# Patient Record
Sex: Male | Born: 1995 | Hispanic: Yes | Marital: Single | Smoking: Never smoker
Health system: Southern US, Community
[De-identification: ages and names within clinical notes are randomized; demographics above are authoritative.]

## PROBLEM LIST (undated history)

## (undated) DIAGNOSIS — R569 Unspecified convulsions: Secondary | ICD-10-CM

## (undated) DIAGNOSIS — J45909 Unspecified asthma, uncomplicated: Secondary | ICD-10-CM

## (undated) HISTORY — DX: Unspecified convulsions: R56.9

---
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
## 2019-08-03 ENCOUNTER — Other Ambulatory Visit: Payer: Self-pay

## 2019-08-03 ENCOUNTER — Ambulatory Visit (HOSPITAL_COMMUNITY)
Admission: EM | Admit: 2019-08-03 | Discharge: 2019-08-03 | Disposition: A | Discharge status: Home / Self Care | Payer: BC Managed Care – PPO | Attending: Family Medicine | Admitting: Family Medicine

## 2019-08-03 ENCOUNTER — Ambulatory Visit (INDEPENDENT_AMBULATORY_CARE_PROVIDER_SITE_OTHER): Payer: BC Managed Care – PPO

## 2019-08-03 ENCOUNTER — Encounter (HOSPITAL_COMMUNITY): Payer: Self-pay

## 2019-08-03 DIAGNOSIS — W19XXXA Unspecified fall, initial encounter: Secondary | ICD-10-CM | POA: Diagnosis not present

## 2019-08-03 DIAGNOSIS — S60012A Contusion of left thumb without damage to nail, initial encounter: Secondary | ICD-10-CM

## 2019-08-03 DIAGNOSIS — M25562 Pain in left knee: Secondary | ICD-10-CM

## 2019-08-03 DIAGNOSIS — S8992XA Unspecified injury of left lower leg, initial encounter: Secondary | ICD-10-CM

## 2019-08-03 DIAGNOSIS — M79642 Pain in left hand: Secondary | ICD-10-CM | POA: Diagnosis not present

## 2019-08-03 MED ORDER — IBUPROFEN 800 MG PO TABS: 800.0000 mg | ORAL_TABLET | Freq: Three times a day (TID) | ORAL | 0 refills | Status: AC

## 2019-08-03 NOTE — ED Triage Notes (Signed)
Pt had a ground level fall this morning and fell onto left knee and left hand. Pt c/o 6/10 throbbing left hand, 5/10 stabbing left knee pain. Pt has numbness in left hand and in certain areas of his left knee. Pt has 1+ edema of left hand, 2+ left radial pulse, cap refill less than 3 sec, 2/5 left hand grip. Pt has 1+ edema of left knee. Pt has scrape on left knee w/dried blood on it.

## 2019-08-03 NOTE — ED Provider Notes (Signed)
[facility]    CSN: [id number] Arrival date & time: 08/03/19  1718      History   Chief Complaint Chief Complaint  Patient presents with  . Fall    HPI Sean Contreras is a 24 y.o. male history of asthma presenting today for evaluation of left hand pain and knee pain after a fall.  Patient was walking backwards, caught his foot on something and caused him to turn and fall and landed on his left side.  His left knee took the main impact of the fall, but also caught his self with his left hand.  Reports a lot of pain around the base of his left thumb.  As well as a lot of pain in his knee.  Denies hitting head or loss of consciousness.  HPI  Past Medical History:  Diagnosis Date  . Asthma   . Seizures (HCC)     There are no problems to display for this patient.   History reviewed. No pertinent surgical history.     Home Medications    Prior to Admission medications   Medication Sig Start Date End Date Taking? Authorizing Provider  ibuprofen (ADVIL) 800 MG tablet Take 1 tablet (800 mg total) by mouth 3 (three) times daily. 08/03/19   Abdulhadi Stopa, Junius Creamer, PA-C    Family History No family history on file.  Social History Social History   Tobacco Use  . Smoking status: Never Smoker  Substance Use Topics  . Alcohol use: Yes    Alcohol/week: 4.0 standard drinks    Types: 4 Shots of liquor per week  . Drug use: No     Allergies   Patient has no known allergies.   Review of Systems Review of Systems  Constitutional: Negative for activity change, chills, diaphoresis and fatigue.  HENT: Negative for ear pain, tinnitus and trouble swallowing.   Eyes: Negative for photophobia and visual disturbance.  Respiratory: Negative for cough, chest tightness and shortness of breath.   Cardiovascular: Negative for chest pain and leg swelling.  Gastrointestinal: Negative for abdominal pain, blood in stool, nausea and vomiting.  Musculoskeletal: Positive for  arthralgias, joint swelling and myalgias. Negative for back pain, gait problem, neck pain and neck stiffness.  Skin: Positive for wound. Negative for color change.  Neurological: Negative for dizziness, weakness, light-headedness, numbness and headaches.     Physical Exam Triage Vital Signs ED Triage Vitals [08/03/19 1740]  Enc Vitals Group     BP 140/88     Pulse Rate 84     Resp 18     Temp 97.9 F (36.6 C)     Temp Source Oral     SpO2 96 %     Weight      Height      Head Circumference      Peak Flow      Pain Score      Pain Loc      Pain Edu?      Excl. in GC?    No data found.  Updated Vital Signs BP 140/88   Pulse 84   Temp 97.9 F (36.6 C) (Oral)   Resp 18   Ht 6\' 1"  (1.854 m)   Wt 280 lb (127 kg)   SpO2 96%   BMI 36.94 kg/m   Visual Acuity Right Eye Distance:   Left Eye Distance:   Bilateral Distance:    Right Eye Near:   Left Eye Near:    Bilateral  Near:     Physical Exam Vitals and nursing note reviewed.  Constitutional:      Appearance: He is well-developed.     Comments: No acute distress  HENT:     Head: Normocephalic and atraumatic.     Nose: Nose normal.  Eyes:     Conjunctiva/sclera: Conjunctivae normal.  Cardiovascular:     Rate and Rhythm: Normal rate.  Pulmonary:     Effort: Pulmonary effort is normal. No respiratory distress.  Abdominal:     General: There is no distension.  Musculoskeletal:        General: Normal range of motion.     Cervical back: Neck supple.     Comments: Left wrist/hand: Moderate swelling noted to dorsum of hand, nontender to distal radius and ulna, tender extending into first metacarpal as well as over thenar eminence left thumb, more tender on palmar surface, no snuffbox tenderness; nontender throughout the second through fifth metacarpals  Left knee: Superficial abrasion noted to anterior knee and maximal tibia anteriorly, diffuse swelling noted throughout knee, positive balloon test, limited range of  motion due to pain, tender to palpation over patella as well as lateral joint line, less tender along medial joint line, tenderness in popliteal area, no significant tenderness or swelling noted into calf No laxity appreciated with varus and valgus stress, negative Lachman's  Ambulating with antalgia  Skin:    General: Skin is warm and dry.     Comments: Superficial abrasion noted to thenar eminence  Neurological:     Mental Status: He is alert and oriented to person, place, and time.      UC Treatments / Results  Labs (all labs ordered are listed, but only abnormal results are displayed) Labs Reviewed - No data to display  EKG   Radiology DG Knee Complete 4 Views Left  Result Date: 08/03/2019 CLINICAL DATA:  Fall, pain EXAM: LEFT KNEE - COMPLETE 4+ VIEW COMPARISON:  None. FINDINGS: No evidence of fracture, dislocation, or joint effusion. No evidence of arthropathy or other focal bone abnormality. Soft tissue edema anteriorly. IMPRESSION: No fracture or dislocation of the left knee. Soft tissue edema anteriorly. Electronically Signed   By: Eddie Candle M.D.   On: 08/03/2019 18:16   DG Hand Complete Left  Result Date: 08/03/2019 CLINICAL DATA:  Fall, pain EXAM: LEFT HAND - COMPLETE 3+ VIEW COMPARISON:  None. FINDINGS: There is no evidence of fracture or dislocation. There is no evidence of arthropathy or other focal bone abnormality. Soft tissues are unremarkable. IMPRESSION: No fracture or dislocation of the left hand. Electronically Signed   By: Eddie Candle M.D.   On: 08/03/2019 18:16    Procedures Procedures (including critical care time)  Medications Ordered in UC Medications - No data to display  Initial Impression / Assessment and Plan / UC Course  I have reviewed the triage vital signs and the nursing notes.  Pertinent labs & imaging results that were available during my care of the patient were reviewed by me and considered in my medical decision making (see chart for  details).     X-rays negative for fracture, no snuffbox tenderness, suspect hand injury likely contusion. Ace wrap to hand/wrist. No acute bony abnormality noted on knee image, cannot rule out soft tissue ligament/meniscal injury. Placing in hinged knee brace and crutches, follow-up with orthopedics if symptoms not improving over the weekend. Ice elevate and rest.  Discussed strict return precautions. Patient verbalized understanding and is agreeable with plan.  Final Clinical  Impressions(s) / UC Diagnoses   Final diagnoses:  Contusion of left thumb without damage to nail, initial encounter  Injury of left knee, initial encounter  Fall, initial encounter     Discharge Instructions     No fractures Use anti-inflammatories for pain/swelling. You may take up to 800 mg Ibuprofen every 8 hours with food. You may supplement Ibuprofen with Tylenol [phone] mg every 8 hours.  Ice and elevate hand and knee May use Ace wrap for compression to hand Crutches and knee brace If you do not see any improvement over the weekend please try to follow-up with orthopedics/sports medicine next week    ED Prescriptions    Medication Sig Dispense Auth. Provider   ibuprofen (ADVIL) 800 MG tablet Take 1 tablet (800 mg total) by mouth 3 (three) times daily. 21 tablet Cooper Stamp, [place] C, PA-C     PDMP not reviewed this encounter.   Lew Dawes, [place] 08/03/19 1914

## 2019-08-03 NOTE — Discharge Instructions (Signed)
No fractures Use anti-inflammatories for pain/swelling. You may take up to 800 mg Ibuprofen every 8 hours with food. You may supplement Ibuprofen with Tylenol [phone] mg every 8 hours.  Ice and elevate hand and knee May use Ace wrap for compression to hand Crutches and knee brace If you do not see any improvement over the weekend please try to follow-up with orthopedics/sports medicine next week

## 2021-01-16 IMAGING — DX DG KNEE COMPLETE 4+V*L*
4 series · 4 of 4 positions shown · non-contrast
Comparison: None.

CLINICAL DATA: Fall, pain

EXAM:
LEFT KNEE - COMPLETE 4+ VIEW

[knee ap]
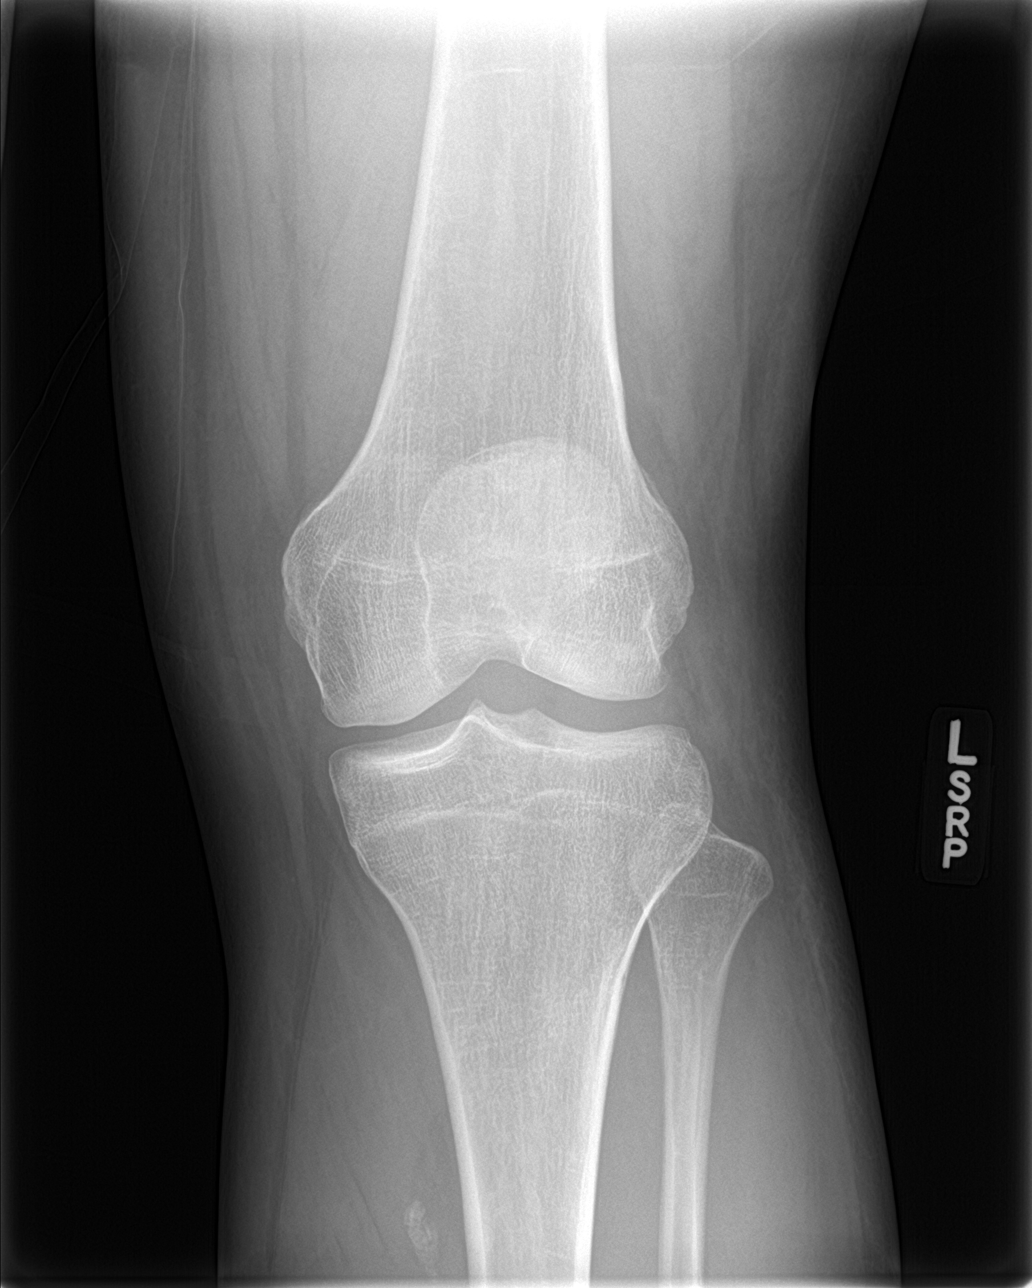

[knee obl (1 of 2)]
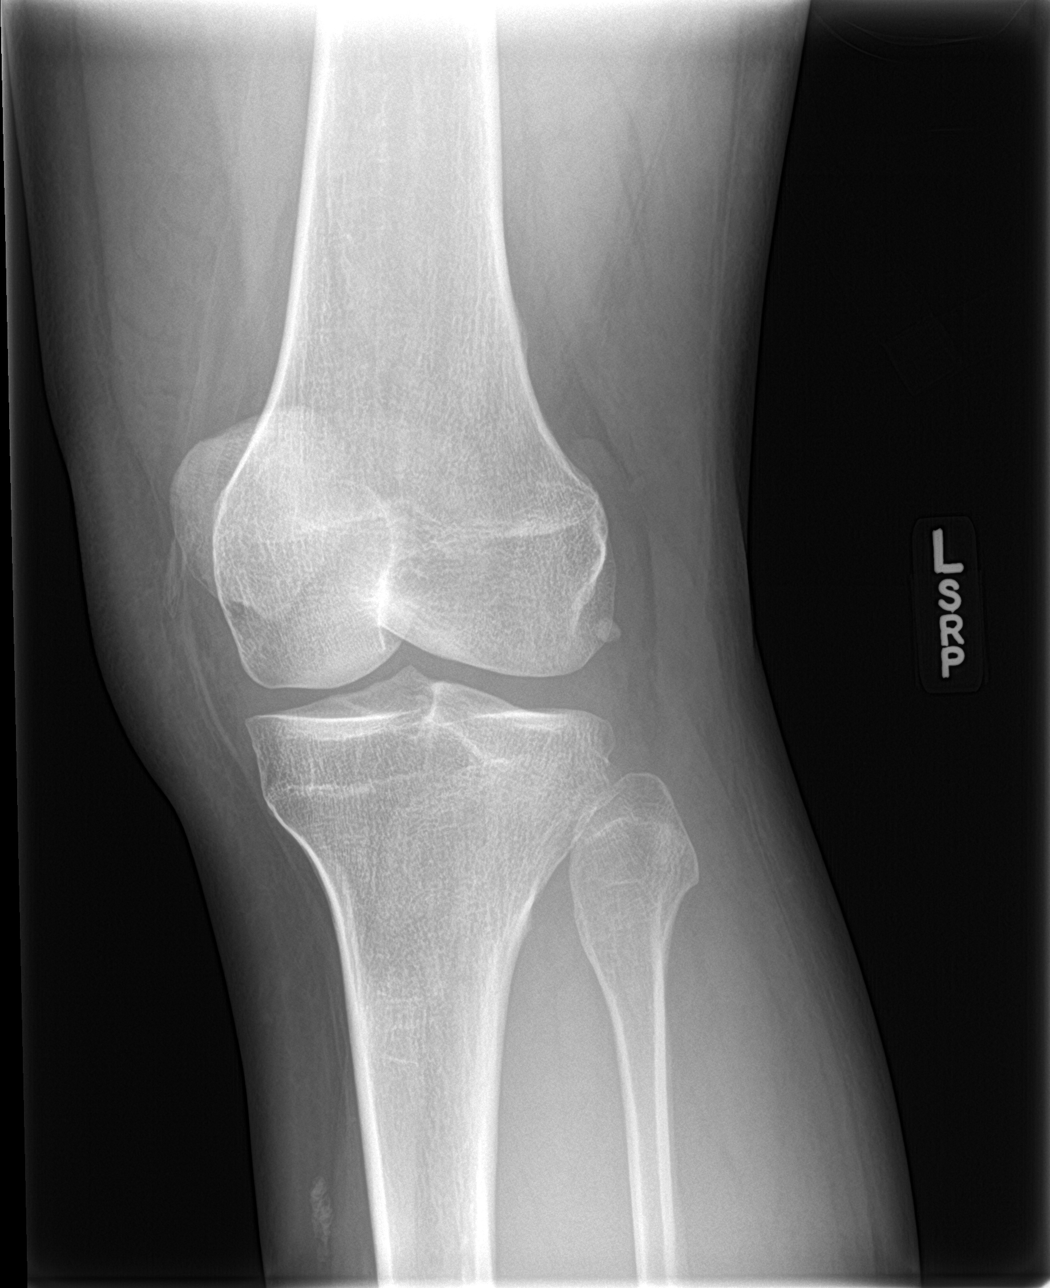

[knee obl (2 of 2)]
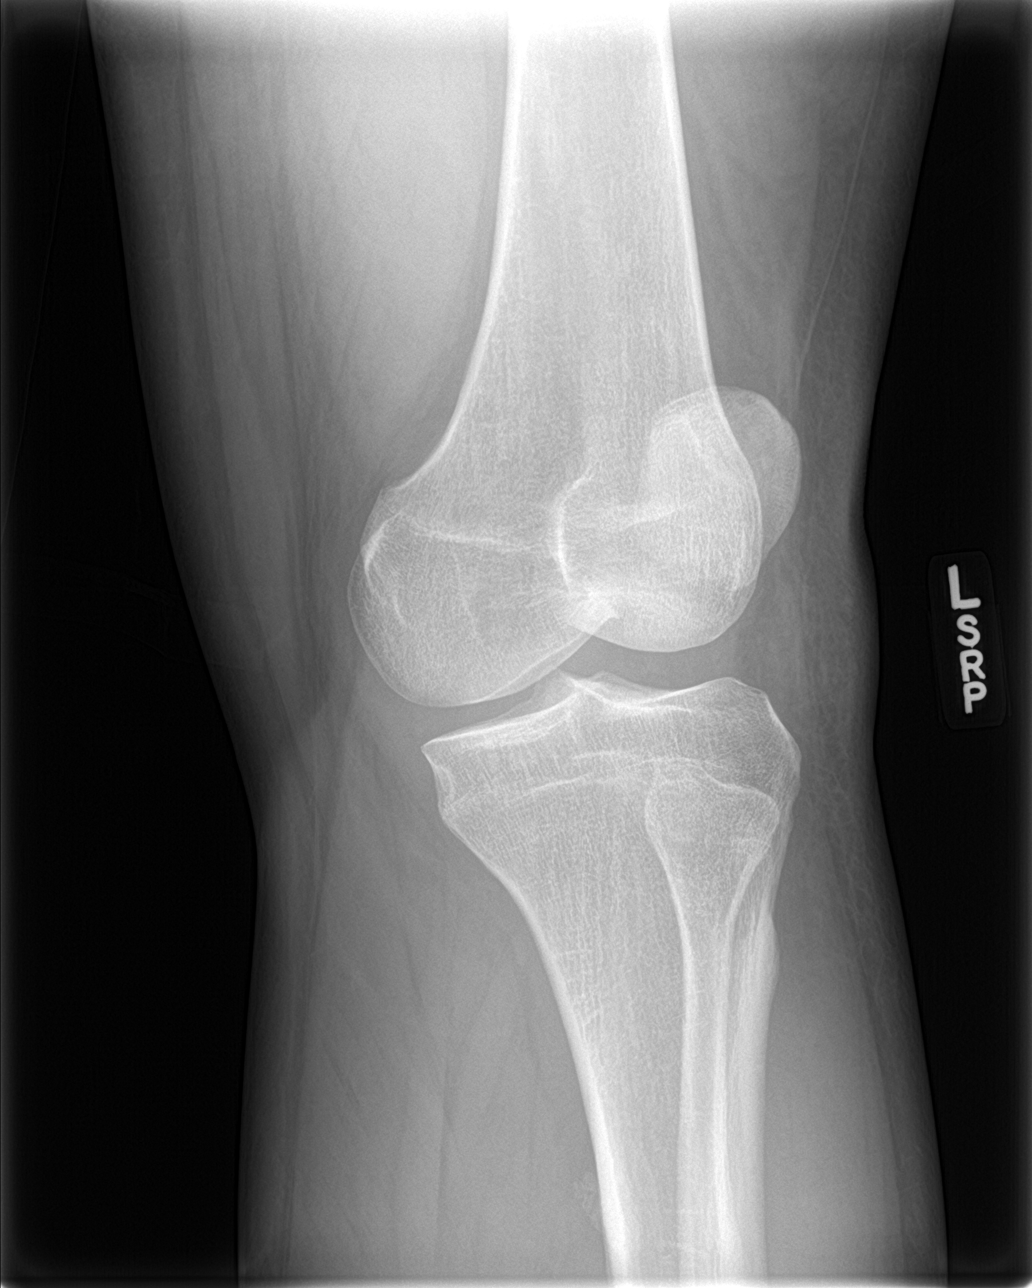

[knee lat]
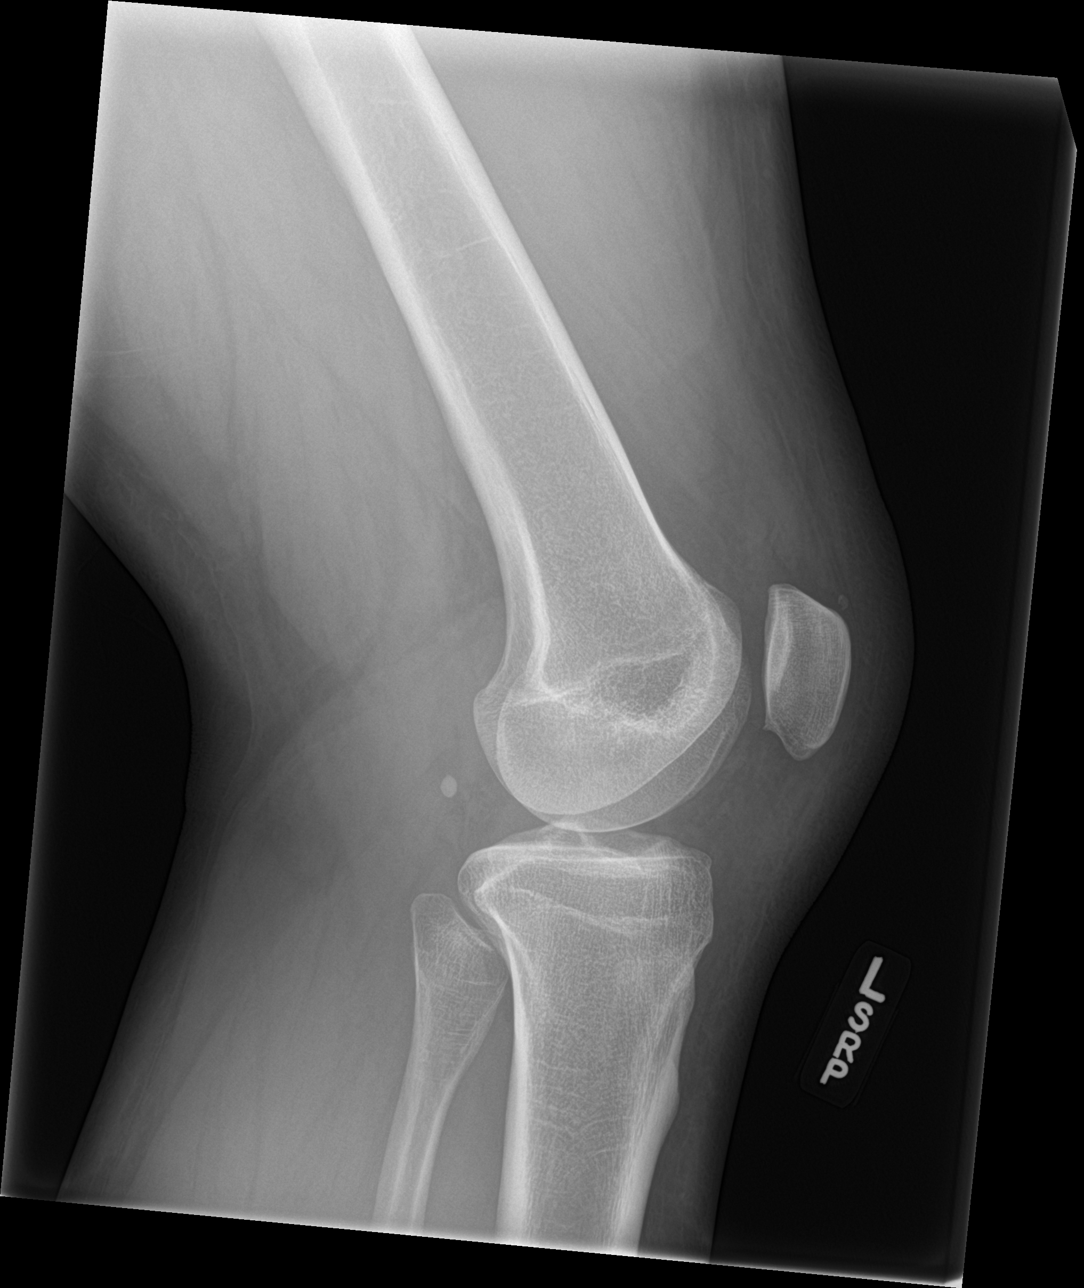

[4 of 4 positions shown; findings below may reference images not displayed]

FINDINGS: No evidence of fracture, dislocation, or joint effusion. No evidence
of arthropathy or other focal bone abnormality. Soft tissue edema
anteriorly.
IMPRESSION: No fracture or dislocation of the left knee. Soft tissue edema
anteriorly.
